# Patient Record
Sex: Female | Born: 2000 | Race: White | Hispanic: No | Marital: Single | Smoking: Never smoker
Health system: Southern US, Community
[De-identification: ages and names within clinical notes are randomized; demographics above are authoritative.]

---
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
## 2020-12-04 DIAGNOSIS — F411 Generalized anxiety disorder: Secondary | ICD-10-CM | POA: Diagnosis not present

## 2020-12-04 DIAGNOSIS — F419 Anxiety disorder, unspecified: Secondary | ICD-10-CM | POA: Diagnosis not present

## 2020-12-18 DIAGNOSIS — F419 Anxiety disorder, unspecified: Secondary | ICD-10-CM | POA: Diagnosis not present

## 2020-12-25 DIAGNOSIS — F419 Anxiety disorder, unspecified: Secondary | ICD-10-CM | POA: Diagnosis not present

## 2021-01-08 DIAGNOSIS — F419 Anxiety disorder, unspecified: Secondary | ICD-10-CM | POA: Diagnosis not present

## 2021-02-19 DIAGNOSIS — F419 Anxiety disorder, unspecified: Secondary | ICD-10-CM | POA: Diagnosis not present

## 2021-03-14 DIAGNOSIS — F419 Anxiety disorder, unspecified: Secondary | ICD-10-CM | POA: Diagnosis not present

## 2021-05-29 DIAGNOSIS — F419 Anxiety disorder, unspecified: Secondary | ICD-10-CM | POA: Diagnosis not present

## 2021-05-30 DIAGNOSIS — Z3049 Encounter for surveillance of other contraceptives: Secondary | ICD-10-CM | POA: Diagnosis not present

## 2021-06-26 DIAGNOSIS — F419 Anxiety disorder, unspecified: Secondary | ICD-10-CM | POA: Diagnosis not present

## 2021-07-15 DIAGNOSIS — J329 Chronic sinusitis, unspecified: Secondary | ICD-10-CM | POA: Diagnosis not present

## 2021-07-21 DIAGNOSIS — F419 Anxiety disorder, unspecified: Secondary | ICD-10-CM | POA: Diagnosis not present

## 2021-08-25 DIAGNOSIS — F419 Anxiety disorder, unspecified: Secondary | ICD-10-CM | POA: Diagnosis not present
